# Patient Record
Sex: Female | Born: 1990 | Race: White | Hispanic: No | Marital: Single | Smoking: Current every day smoker
Health system: Southern US, Community
[De-identification: ages and names within clinical notes are randomized; demographics above are authoritative.]

## PROBLEM LIST (undated history)

## (undated) DIAGNOSIS — F32A Depression, unspecified: Secondary | ICD-10-CM

## (undated) DIAGNOSIS — F419 Anxiety disorder, unspecified: Secondary | ICD-10-CM

## (undated) HISTORY — PX: BACK SURGERY: SHX140

## (undated) HISTORY — PX: TONSILLECTOMY: SUR1361

---

## 2014-10-09 ENCOUNTER — Emergency Department (HOSPITAL_COMMUNITY)
Admission: EM | Admit: 2014-10-09 | Discharge: 2014-10-09 | Disposition: A | Discharge status: Home / Self Care | Payer: Self-pay | Attending: Emergency Medicine | Admitting: Emergency Medicine

## 2014-10-09 ENCOUNTER — Encounter (HOSPITAL_COMMUNITY): Payer: Self-pay | Admitting: Neurology

## 2014-10-09 DIAGNOSIS — N898 Other specified noninflammatory disorders of vagina: Secondary | ICD-10-CM | POA: Insufficient documentation

## 2014-10-09 DIAGNOSIS — Z720 Tobacco use: Secondary | ICD-10-CM | POA: Insufficient documentation

## 2014-10-09 DIAGNOSIS — Z880 Allergy status to penicillin: Secondary | ICD-10-CM | POA: Insufficient documentation

## 2014-10-09 LAB — URINALYSIS, ROUTINE W REFLEX MICROSCOPIC
Glucose, UA: NEGATIVE mg/dL
Hgb urine dipstick: NEGATIVE
Ketones, ur: NEGATIVE mg/dL
LEUKOCYTES UA: NEGATIVE
Nitrite: NEGATIVE
PROTEIN: NEGATIVE mg/dL
Specific Gravity, Urine: 1.025 (ref 1.005–1.030)
UROBILINOGEN UA: 1 mg/dL (ref 0.0–1.0)
pH: 6 (ref 5.0–8.0)

## 2014-10-09 LAB — WET PREP, GENITAL
Clue Cells Wet Prep HPF POC: NONE SEEN
Trich, Wet Prep: NONE SEEN
Yeast Wet Prep HPF POC: NONE SEEN

## 2014-10-09 NOTE — ED Notes (Signed)
Pt reports since Monday lower abd pain and white color vaginal discharge.

## 2014-10-09 NOTE — ED Provider Notes (Signed)
CSN: [id number]     Arrival date & time 10/09/14  1717 History   First MD Initiated Contact with Patient 10/09/14 1809     Chief Complaint  Patient presents with  . Vaginal Discharge  . Abdominal Pain     (Consider location/radiation/quality/duration/timing/severity/associated sxs/prior Treatment) Patient is a 24 y.o. female presenting with vaginal discharge. The history is provided by the patient. No language interpreter was used.  Vaginal Discharge Quality:  Clear and white Severity:  Moderate Onset quality:  Gradual Timing:  Intermittent Progression:  Waxing and waning Chronicity:  New Context: spontaneously   Relieved by:  Nothing Worsened by:  Nothing tried Ineffective treatments:  None tried Associated symptoms: no abdominal pain, no dyspareunia, no dysuria, no fever, no genital lesions, no nausea, no rash, no urinary frequency, no urinary incontinence, no vaginal itching and no vomiting   Risk factors: no foreign body, no gynecological surgery and no new sexual partner     History reviewed. No pertinent past medical history. Past Surgical History  Procedure Laterality Date  . Back surgery    . Tonsillectomy     No family history on file. History  Substance Use Topics  . Smoking status: Current Every Day Smoker  . Smokeless tobacco: Not on file  . Alcohol Use: Yes   OB History    No data available     Review of Systems  Constitutional: Negative for fever and fatigue.  Respiratory: Negative for cough, chest tightness and shortness of breath.   Cardiovascular: Negative for chest pain.  Gastrointestinal: Negative for nausea, vomiting and abdominal pain.  Genitourinary: Positive for vaginal discharge. Negative for bladder incontinence, dysuria, urgency, frequency, flank pain, vaginal bleeding, genital sores, menstrual problem, pelvic pain and dyspareunia.  Neurological: Negative for weakness, light-headedness and headaches.  Psychiatric/Behavioral: Negative for  confusion.  All other systems reviewed and are negative.     Allergies  Penicillins  Home Medications   Prior to Admission medications   Not on File   BP 118/58 mmHg  Pulse 79  Temp(Src) 98.8 F (37.1 C) (Oral)  Resp 14  SpO2 99%  LMP 10/05/2014 Physical Exam  Constitutional: She appears well-developed and well-nourished. No distress.  HENT:  Head: Normocephalic and atraumatic.  Nose: Nose normal.  Mouth/Throat: Oropharynx is clear and moist. No oropharyngeal exudate.  Eyes: EOM are normal. Pupils are equal, round, and reactive to light.  Neck: Normal range of motion. Neck supple.  Cardiovascular: Normal rate, regular rhythm, normal heart sounds and intact distal pulses.   No murmur heard. Pulmonary/Chest: Effort normal and breath sounds normal. No respiratory distress. She has no wheezes. She exhibits no tenderness.  Abdominal: Soft. There is no tenderness. There is no rebound and no guarding.  Genitourinary:  Small amount of thin white vaginal discharge.   Nontender, no lesions. No CMT or adnexal tenderness  Musculoskeletal: Normal range of motion. She exhibits no tenderness.  Lymphadenopathy:    She has no cervical adenopathy.  Neurological: She is alert. No cranial nerve deficit. Coordination normal.  Skin: Skin is warm and dry. She is not diaphoretic.  Psychiatric: She has a normal mood and affect. Her behavior is normal. Judgment and thought content normal.  Nursing note and vitals reviewed.   ED Course  Procedures (including critical care time) Labs Review Labs Reviewed  WET PREP, GENITAL - Abnormal; Notable for the following:    WBC, Wet Prep HPF POC FEW (*)    All other components within normal limits  URINALYSIS, ROUTINE  W REFLEX MICROSCOPIC (NOT AT [facility]) - Abnormal; Notable for the following:    Color, Urine AMBER (*)    Bilirubin Urine SMALL (*)    All other components within normal limits  POC URINE PREG, ED  GC/CHLAMYDIA PROBE AMP ([place])  NOT AT [facility]    Imaging Review No results found.   EKG Interpretation None      MDM   Final diagnoses:  Vaginal discharge   Pt is a 24 yo F with no PMH who presents complaining of vaginal discharge.  Has had thin white discharge daily since her menstrual cycle ended 5 days ago.  Reportedly bled her typical amount for 5 days, then finished on Monday.  Used tampons regularly.  Has had persistent discharge since.  Denies malodor.  Denies dysuria, change in urinary consistency or frequency.  Sexually active with males and uses condoms regularly.  Hx of STD 10 years ago, and BV 2 years ago.    Pelvic exam benign.  Small amount of thin white vaginal discharge.   Nontender, no lesions.   Will send a UA, UPT, wet prep and GC/CZ  UPT negative Wet prep with few WBC, otherwise negative.   UA with no nitrites or leuks.  No signs of infection.   No indication that patient has a UTI, BV, or yeast infection.  Clinical exam is benign, no CMT or abdominal tenderness.  Ok for discharge and f/u of GC/CZ to rule out other common STDs.  The discharge is likely just physiological discharge but will f/u STD PRCs to rule out.  Given ED return precautions and all questions were answered prior to dc home in good condition.  Advised that she will receive a phone call if her swabs return positive in the next few days.     Labs were reviewed and interpreted by myself and my attending, and incorporated in the medical decision making.  Patient was seen with ED Attending, Dr. Derrick Ravel, MD   Lenell Antu, MD 10/10/14 1610  Gerhard Munch, MD 10/13/14 1019

## 2014-10-13 LAB — GC/CHLAMYDIA PROBE AMP (~~LOC~~) NOT AT ARMC
Chlamydia: NEGATIVE
Neisseria Gonorrhea: NEGATIVE

## 2015-04-10 ENCOUNTER — Emergency Department (HOSPITAL_COMMUNITY)
Admission: EM | Admit: 2015-04-10 | Discharge: 2015-04-10 | Disposition: A | Discharge status: Home / Self Care | Payer: Self-pay | Attending: Emergency Medicine | Admitting: Emergency Medicine

## 2015-04-10 ENCOUNTER — Encounter (HOSPITAL_COMMUNITY): Payer: Self-pay

## 2015-04-10 DIAGNOSIS — R05 Cough: Secondary | ICD-10-CM

## 2015-04-10 DIAGNOSIS — J069 Acute upper respiratory infection, unspecified: Secondary | ICD-10-CM | POA: Insufficient documentation

## 2015-04-10 DIAGNOSIS — Z880 Allergy status to penicillin: Secondary | ICD-10-CM | POA: Insufficient documentation

## 2015-04-10 DIAGNOSIS — H66004 Acute suppurative otitis media without spontaneous rupture of ear drum, recurrent, right ear: Secondary | ICD-10-CM | POA: Insufficient documentation

## 2015-04-10 DIAGNOSIS — F17210 Nicotine dependence, cigarettes, uncomplicated: Secondary | ICD-10-CM | POA: Insufficient documentation

## 2015-04-10 DIAGNOSIS — H7491 Unspecified disorder of right middle ear and mastoid: Secondary | ICD-10-CM | POA: Insufficient documentation

## 2015-04-10 DIAGNOSIS — R059 Cough, unspecified: Secondary | ICD-10-CM

## 2015-04-10 MED ORDER — AMOXICILLIN 500 MG PO TABS: 500.0000 mg | ORAL_TABLET | Freq: Three times a day (TID) | ORAL | Status: DC

## 2015-04-10 NOTE — ED Notes (Signed)
Onset 1 month runny nose, nasal congestion, post nasal drip.  Onset 2 days ago symptoms worsened now has productive cough- green phlegm, pain in back of neck, sinus pressure, nasal drainage yellow-green.  No swallowing or respiratory difficulties.

## 2015-04-10 NOTE — ED Provider Notes (Signed)
CSN: [id number]     Arrival date & time 04/10/15  1352 History   First MD Initiated Contact with Patient 04/10/15 1519     Chief Complaint  Patient presents with  . URI     (Consider location/radiation/quality/duration/timing/severity/associated sxs/prior Treatment) HPI Comments: Jacqueline Ferguson is a 24 y.o. female with a PSHx of tonsillectomy, who presents to the ED with complaints of URI symptoms 1 month which have worsened over the last 2 days. Her symptoms include cough with green sputum production, sinus pressure, yellowish green rhinorrhea, chills, sore throat, and right ear fullness. Her symptoms have somewhat improved over the course of the month, but 2 days ago they drastically worsened. She has tried TheraFlu, DayQuil, Alka-Seltzer, Robitussin, and Mucinex with some relief. No known diabetes factors. Positive smoker. 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 She denies any fevers, ear pain or drainage, wheezing, drooling, trismus, chest pain, shortness breath, abdominal pain, nausea vomiting, diarrhea, constipation, dysuria, hematuria, numbness, tingling, or weakness. She has a reported allergy to penicillin, but her mother states that she's never been given penicillin it was simply that her mother had a reaction as a child and so when the pt was born they told them the pt was also allergic to PCN. She does not in fact have any known allergies and has never been trialed on penicillins. +Hx of recurrent ear infections as a child.  Patient is a 24 y.o. female presenting with URI. The history is provided by the patient. No language interpreter was used.  URI Presenting symptoms: congestion, cough, ear pain (fullness in R ear), rhinorrhea and sore throat   Presenting symptoms: no fever   Severity:  Moderate Onset quality:  Gradual Duration:  1 month Timing:  Constant Progression:  Worsening Chronicity:  New Relieved by:  OTC medications Worsened by:  Nothing  tried Ineffective treatments:  None tried Associated symptoms: no arthralgias, no myalgias and no wheezing   Risk factors: sick contacts   Risk factors: no immunosuppression     History reviewed. No pertinent past medical history. Past Surgical History  Procedure Laterality Date  . Back surgery    . Tonsillectomy     History reviewed. No pertinent family history. Social History  Substance Use Topics  . Smoking status: Current Every Day Smoker -- 0.50 packs/day    Types: Cigarettes  . Smokeless tobacco: None  . Alcohol Use: Yes     Comment: occ    OB History    No data available     Review of Systems  Constitutional: Positive for chills. Negative for fever.  HENT: Positive for congestion, ear pain (fullness in R ear), rhinorrhea, sinus pressure and sore throat. Negative for drooling, ear discharge and trouble swallowing.   Respiratory: Positive for cough. Negative for shortness of breath and wheezing.   Cardiovascular: Negative for chest pain.  Gastrointestinal: Negative for nausea, vomiting, abdominal pain, diarrhea and constipation.  Genitourinary: Negative for dysuria and hematuria.  Musculoskeletal: Negative for myalgias and arthralgias.  Skin: Negative for color change.  Allergic/Immunologic: Negative for immunocompromised state.  Neurological: Negative for weakness and numbness.  Psychiatric/Behavioral: Negative for confusion.   10 Systems reviewed and are negative for acute change except as noted in the HPI.    Allergies  Penicillins  Home Medications   Prior to Admission medications   Not on File   BP 117/71 mmHg  Pulse 84  Temp(Src) 97.9 F (36.6 C) (Oral)  Resp 18  Ht 5'  7" (1.702 m)  Wt 117.935 kg  BMI 40.71 kg/m2  SpO2 97%  LMP 04/09/2015 Physical Exam  Constitutional: She is oriented to person, place, and time. Vital signs are normal. She appears well-developed and well-nourished.  Non-toxic appearance. No distress.  Afebrile, nontoxic, NAD   HENT:  Head: Normocephalic and atraumatic.  Right Ear: Hearing, external ear and ear canal normal. Tympanic membrane is injected, erythematous and bulging. A middle ear effusion is present.  Left Ear: Hearing, tympanic membrane, external ear and ear canal normal.  Nose: Mucosal edema and rhinorrhea present.  Mouth/Throat: Uvula is midline and mucous membranes are normal. No trismus in the jaw. No uvula swelling. Oropharyngeal exudate and posterior oropharyngeal erythema present. No posterior oropharyngeal edema or tonsillar abscesses.  R ear with suppurative effusion, erythematous TM which is bulging. Canal clear. L ear clear. Nose with mucosal edema and rhinorrhea. Oropharynx without uvular swelling or deviation, no trismus or drooling, no tonsils present, mild erythema, few exudates noted on posterior pillars.  Eyes: Conjunctivae and EOM are normal. Right eye exhibits no discharge. Left eye exhibits no discharge.  Neck: Normal range of motion. Neck supple.  Cardiovascular: Normal rate, regular rhythm, normal heart sounds and intact distal pulses.  Exam reveals no gallop and no friction rub.   No murmur heard. Pulmonary/Chest: Effort normal and breath sounds normal. No respiratory distress. She has no decreased breath sounds. She has no wheezes. She has no rhonchi. She has no rales.  CTAB in all lung fields, no w/r/r, no hypoxia or increased WOB, speaking in full sentences, SpO2 97% on RA   Abdominal: Soft. Normal appearance and bowel sounds are normal. She exhibits no distension. There is no tenderness. There is no rigidity, no rebound, no guarding, no CVA tenderness, no tenderness at McBurney's point and negative Murphy's sign.  Musculoskeletal: Normal range of motion.  Neurological: She is alert and oriented to person, place, and time. She has normal strength. No sensory deficit.  Skin: Skin is warm, dry and intact. No rash noted.  Psychiatric: She has a normal mood and affect.  Nursing note  and vitals reviewed.   ED Course  Procedures (including critical care time) Labs Review Labs Reviewed - No data to display  Imaging Review No results found. I have personally reviewed and evaluated these images and lab results as part of my medical decision-making.   EKG Interpretation None      MDM   Final diagnoses:  Recurrent acute suppurative otitis media of right ear without spontaneous rupture of tympanic membrane  URI (upper respiratory infection)  Cough    24 y.o. female here with cough, right ear fullness, chills, sore throat, and URI symptoms 1 month that initially improved and then worsened 2 days ago. R otitis media noted. Throat with erythema but pt without tonsils. Clear lung exam. Doubt need for strep testing or CXR. Given duration of symptoms, and suppurative AOM, will treat with amoxicillin. Should be noted that pt doesn't have known allergy to PCN, her mother had an allergy and therefore they always stated that she did too, but she has never had PCN as a child. Pt without insurance, wants cheapest Abx. Will treat with amoxicillin on $4 list at [facility]. F/up with [facility] in 1wk to establish care and recheck symptoms. OTC meds discussed. I explained the diagnosis and have given explicit precautions to return to the ER including for any other new or worsening symptoms. The patient understands and accepts the medical plan as it's been  dictated and I have answered their questions. Discharge instructions concerning home care and prescriptions have been given. The patient is STABLE and is discharged to home in good condition.   BP 117/71 mmHg  Pulse 84  Temp(Src) 97.9 F (36.6 C) (Oral)  Resp 18  Ht 5\' 7"  (1.702 m)  Wt 117.935 kg  BMI 40.71 kg/m2  SpO2 97%  LMP 04/09/2015  Meds ordered this encounter  Medications  . amoxicillin (AMOXIL) 500 MG tablet    Sig: Take 1 tablet (500 mg total) by mouth 3 (three) times daily. x10 days    Dispense:  30 tablet    Refill:  0     Order Specific Question:  Supervising Provider    Answer:  Eber HongMILLER, BRIAN [[id number]]     Remon Quinto Camprubi-Soms, PA-C 04/10/15 1542  Derwood KaplanAnkit Nanavati, MD 04/11/15 1547

## 2015-04-10 NOTE — Discharge Instructions (Signed)
Continue to stay well-hydrated. Gargle warm salt water and spit it out. Continue to alternate between Tylenol and Ibuprofen for pain or fever. Use Mucinex for cough suppression/expectoration of mucus. Use netipot and flonase to help with nasal congestion. May consider over-the-counter Benadryl or other antihistamine to decrease secretions and for watery itchy eyes. Take antibiotic as directed for your ear infection. Followup with [facility] in 5-7 days for recheck of ongoing symptoms and to establish care. Return to emergency department for emergent changing or worsening of symptoms.    Otitis Media, Adult Otitis media is redness, soreness, and inflammation of the middle ear. Otitis media may be caused by allergies or, most commonly, by infection. Often it occurs as a complication of the common cold. SIGNS AND SYMPTOMS Symptoms of otitis media may include:  Earache.  Fever.  Ringing in your ear.  Headache.  Leakage of fluid from the ear. DIAGNOSIS To diagnose otitis media, your health care provider will examine your ear with an otoscope. This is an instrument that allows your health care provider to see into your ear in order to examine your eardrum. Your health care provider also will ask you questions about your symptoms. TREATMENT  Typically, otitis media resolves on its own within 3-5 days. Your health care provider may prescribe medicine to ease your symptoms of pain. If otitis media does not resolve within 5 days or is recurrent, your health care provider may prescribe antibiotic medicines if he or she suspects that a bacterial infection is the cause. HOME CARE INSTRUCTIONS   If you were prescribed an antibiotic medicine, finish it all even if you start to feel better.  Take medicines only as directed by your health care provider.  Keep all follow-up visits as directed by your health care provider. SEEK MEDICAL CARE IF:  You have otitis media only in one ear, or  bleeding from your nose, or both.  You notice a lump on your neck.  You are not getting better in 3-5 days.  You feel worse instead of better. SEEK IMMEDIATE MEDICAL CARE IF:   You have pain that is not controlled with medicine.  You have swelling, redness, or pain around your ear or stiffness in your neck.  You notice that part of your face is paralyzed.  You notice that the bone behind your ear (mastoid) is tender when you touch it. MAKE SURE YOU:   Understand these instructions.  Will watch your condition.  Will get help right away if you are not doing well or get worse.   This information is not intended to replace advice given to you by your health care provider. Make sure you discuss any questions you have with your health care provider.   Document Released: 12/31/2003 Document Revised: 04/17/2014 Document Reviewed: 10/22/2012 Elsevier Interactive Patient Education 2016 Elsevier Inc.  Upper Respiratory Infection, Adult Most upper respiratory infections (URIs) are a viral infection of the air passages leading to the lungs. A URI affects the nose, throat, and upper air passages. The most common type of URI is nasopharyngitis and is typically referred to as "the common cold." URIs run their course and usually go away on their own. Most of the time, a URI does not require medical attention, but sometimes a bacterial infection in the upper airways can follow a viral infection. This is called a secondary infection. Sinus and middle ear infections are common types of secondary upper respiratory infections. Bacterial pneumonia can also complicate a URI. A URI  can worsen asthma and chronic obstructive pulmonary disease (COPD). Sometimes, these complications can require emergency medical care and may be life threatening.  CAUSES Almost all URIs are caused by viruses. A virus is a type of germ and can spread from one person to another.  RISKS FACTORS You may be at risk for a URI if:     You smoke.   You have chronic heart or lung disease.  You have a weakened defense (immune) system.   You are very young or very old.   You have nasal allergies or asthma.  You work in crowded or poorly ventilated areas.  You work in health care facilities or schools. SIGNS AND SYMPTOMS  Symptoms typically develop 2-3 days after you come in contact with a cold virus. Most viral URIs last 7-10 days. However, viral URIs from the influenza virus (flu virus) can last 14-18 days and are typically more severe. Symptoms may include:   Runny or stuffy (congested) nose.   Sneezing.   Cough.   Sore throat.   Headache.   Fatigue.   Fever.   Loss of appetite.   Pain in your forehead, behind your eyes, and over your cheekbones (sinus pain).  Muscle aches.  DIAGNOSIS  Your health care provider may diagnose a URI by:  Physical exam.  Tests to check that your symptoms are not due to another condition such as:  Strep throat.  Sinusitis.  Pneumonia.  Asthma. TREATMENT  A URI goes away on its own with time. It cannot be cured with medicines, but medicines may be prescribed or recommended to relieve symptoms. Medicines may help:  Reduce your fever.  Reduce your cough.  Relieve nasal congestion. HOME CARE INSTRUCTIONS   Take medicines only as directed by your health care provider.   Gargle warm saltwater or take cough drops to comfort your throat as directed by your health care provider.  Use a warm mist humidifier or inhale steam from a shower to increase air moisture. This may make it easier to breathe.  Drink enough fluid to keep your urine clear or pale yellow.   Eat soups and other clear broths and maintain good nutrition.   Rest as needed.   Return to work when your temperature has returned to normal or as your health care provider advises. You may need to stay home longer to avoid infecting others. You can also use a face mask and careful  hand washing to prevent spread of the virus.  Increase the usage of your inhaler if you have asthma.   Do not use any tobacco products, including cigarettes, chewing tobacco, or electronic cigarettes. If you need help quitting, ask your health care provider. PREVENTION  The best way to protect yourself from getting a cold is to practice good hygiene.   Avoid oral or hand contact with people with cold symptoms.   Wash your hands often if contact occurs.  There is no clear evidence that vitamin C, vitamin E, echinacea, or exercise reduces the chance of developing a cold. However, it is always recommended to get plenty of rest, exercise, and practice good nutrition.  SEEK MEDICAL CARE IF:   You are getting worse rather than better.   Your symptoms are not controlled by medicine.   You have chills.  You have worsening shortness of breath.  You have brown or red mucus.  You have yellow or brown nasal discharge.  You have pain in your face, especially when you bend forward.  You  have a fever.  You have swollen neck glands.  You have pain while swallowing.  You have white areas in the back of your throat. SEEK IMMEDIATE MEDICAL CARE IF:   You have severe or persistent:  Headache.  Ear pain.  Sinus pain.  Chest pain.  You have chronic lung disease and any of the following:  Wheezing.  Prolonged cough.  Coughing up blood.  A change in your usual mucus.  You have a stiff neck.  You have changes in your:  Vision.  Hearing.  Thinking.  Mood. MAKE SURE YOU:   Understand these instructions.  Will watch your condition.  Will get help right away if you are not doing well or get worse.   This information is not intended to replace advice given to you by your health care provider. Make sure you discuss any questions you have with your health care provider.   Document Released: 09/20/2000 Document Revised: 08/11/2014 Document Reviewed:  07/02/2013 Elsevier Interactive Patient Education 2016 Elsevier Inc.  Cough, Adult A cough helps to clear your throat and lungs. A cough may last only 2-3 weeks (acute), or it may last longer than 8 weeks (chronic). Many different things can cause a cough. A cough may be a sign of an illness or another medical condition. HOME CARE  Pay attention to any changes in your cough.  Take medicines only as told by your doctor.  If you were prescribed an antibiotic medicine, take it as told by your doctor. Do not stop taking it even if you start to feel better.  Talk with your doctor before you try using a cough medicine.  Drink enough fluid to keep your pee (urine) clear or pale yellow.  If the air is dry, use a cold steam vaporizer or humidifier in your home.  Stay away from things that make you cough at work or at home.  If your cough is worse at night, try using extra pillows to raise your head up higher while you sleep.  Do not smoke, and try not to be around smoke. If you need help quitting, ask your doctor.  Do not have caffeine.  Do not drink alcohol.  Rest as needed. GET HELP IF:  You have new problems (symptoms).  You cough up yellow fluid (pus).  Your cough does not get better after 2-3 weeks, or your cough gets worse.  Medicine does not help your cough and you are not sleeping well.  You have pain that gets worse or pain that is not helped with medicine.  You have a fever.  You are losing weight and you do not know why.  You have night sweats. GET HELP RIGHT AWAY IF:  You cough up blood.  You have trouble breathing.  Your heartbeat is very fast.   This information is not intended to replace advice given to you by your health care provider. Make sure you discuss any questions you have with your health care provider.   Document Released: 12/08/2010 Document Revised: 12/16/2014 Document Reviewed: 06/03/2014 Elsevier Interactive Patient Education [employer].

## 2015-06-15 ENCOUNTER — Emergency Department (HOSPITAL_COMMUNITY)
Admission: EM | Admit: 2015-06-15 | Discharge: 2015-06-15 | Disposition: A | Discharge status: Home / Self Care | Payer: Self-pay | Attending: Emergency Medicine | Admitting: Emergency Medicine

## 2015-06-15 ENCOUNTER — Encounter (HOSPITAL_COMMUNITY): Payer: Self-pay | Admitting: Emergency Medicine

## 2015-06-15 DIAGNOSIS — Z880 Allergy status to penicillin: Secondary | ICD-10-CM | POA: Insufficient documentation

## 2015-06-15 DIAGNOSIS — R112 Nausea with vomiting, unspecified: Secondary | ICD-10-CM | POA: Insufficient documentation

## 2015-06-15 DIAGNOSIS — Z792 Long term (current) use of antibiotics: Secondary | ICD-10-CM | POA: Insufficient documentation

## 2015-06-15 DIAGNOSIS — J069 Acute upper respiratory infection, unspecified: Secondary | ICD-10-CM | POA: Insufficient documentation

## 2015-06-15 DIAGNOSIS — F17210 Nicotine dependence, cigarettes, uncomplicated: Secondary | ICD-10-CM | POA: Insufficient documentation

## 2015-06-15 DIAGNOSIS — R197 Diarrhea, unspecified: Secondary | ICD-10-CM | POA: Insufficient documentation

## 2015-06-15 DIAGNOSIS — H6691 Otitis media, unspecified, right ear: Secondary | ICD-10-CM | POA: Insufficient documentation

## 2015-06-15 LAB — RAPID STREP SCREEN (MED CTR MEBANE ONLY): Streptococcus, Group A Screen (Direct): NEGATIVE

## 2015-06-15 MED ORDER — LIDOCAINE HCL (PF) 1 % IJ SOLN
5.0000 mL | Freq: Once | INTRAMUSCULAR | Status: AC
  Administered 2015-06-15: 5 mL
  Filled 2015-06-15: qty 5

## 2015-06-15 MED ORDER — LIDOCAINE VISCOUS 2 % MT SOLN
15.0000 mL | Freq: Once | OROMUCOSAL | Status: AC
  Administered 2015-06-15: 15 mL via OROMUCOSAL
  Filled 2015-06-15: qty 15

## 2015-06-15 MED ORDER — CEFTRIAXONE SODIUM 1 G IJ SOLR
2.0000 g | Freq: Once | INTRAMUSCULAR | Status: AC
  Administered 2015-06-15: 1 g via INTRAMUSCULAR
  Filled 2015-06-15: qty 20

## 2015-06-15 MED ORDER — LIDOCAINE VISCOUS 2 % MT SOLN: 20.0000 mL | OROMUCOSAL | Status: DC | PRN

## 2015-06-15 MED ORDER — LIDOCAINE VISCOUS 2 % MT SOLN: 15.0000 mL | Freq: Once | OROMUCOSAL | Status: DC

## 2015-06-15 NOTE — ED Notes (Signed)
Second injection given for total of 2 Gm Rocephin

## 2015-06-15 NOTE — Discharge Instructions (Signed)
Upper Respiratory Infection, Adult Follow-up with ENT for recurrent right otitis media. Return if symptoms do not improve within 48 hours and you continue to have ear drainage. Drink plenty of fluids and stay well-hydrated. Most upper respiratory infections (URIs) are a viral infection of the air passages leading to the lungs. A URI affects the nose, throat, and upper air passages. The most common type of URI is nasopharyngitis and is typically referred to as "the common cold." URIs run their course and usually go away on their own. Most of the time, a URI does not require medical attention, but sometimes a bacterial infection in the upper airways can follow a viral infection. This is called a secondary infection. Sinus and middle ear infections are common types of secondary upper respiratory infections. Bacterial pneumonia can also complicate a URI. A URI can worsen asthma and chronic obstructive pulmonary disease (COPD). Sometimes, these complications can require emergency medical care and may be life threatening.  CAUSES Almost all URIs are caused by viruses. A virus is a type of germ and can spread from one person to another.  RISKS FACTORS You may be at risk for a URI if:   You smoke.   You have chronic heart or lung disease.  You have a weakened defense (immune) system.   You are very young or very old.   You have nasal allergies or asthma.  You work in crowded or poorly ventilated areas.  You work in health care facilities or schools. SIGNS AND SYMPTOMS  Symptoms typically develop 2-3 days after you come in contact with a cold virus. Most viral URIs last 7-10 days. However, viral URIs from the influenza virus (flu virus) can last 14-18 days and are typically more severe. Symptoms may include:   Runny or stuffy (congested) nose.   Sneezing.   Cough.   Sore throat.   Headache.   Fatigue.   Fever.   Loss of appetite.   Pain in your forehead, behind your eyes,  and over your cheekbones (sinus pain).  Muscle aches.  DIAGNOSIS  Your health care provider may diagnose a URI by:  Physical exam.  Tests to check that your symptoms are not due to another condition such as:  Strep throat.  Sinusitis.  Pneumonia.  Asthma. TREATMENT  A URI goes away on its own with time. It cannot be cured with medicines, but medicines may be prescribed or recommended to relieve symptoms. Medicines may help:  Reduce your fever.  Reduce your cough.  Relieve nasal congestion. HOME CARE INSTRUCTIONS   Take medicines only as directed by your health care provider.   Gargle warm saltwater or take cough drops to comfort your throat as directed by your health care provider.  Use a warm mist humidifier or inhale steam from a shower to increase air moisture. This may make it easier to breathe.  Drink enough fluid to keep your urine clear or pale yellow.   Eat soups and other clear broths and maintain good nutrition.   Rest as needed.   Return to work when your temperature has returned to normal or as your health care provider advises. You may need to stay home longer to avoid infecting others. You can also use a face mask and careful hand washing to prevent spread of the virus.  Increase the usage of your inhaler if you have asthma.   Do not use any tobacco products, including cigarettes, chewing tobacco, or electronic cigarettes. If you need help quitting, ask your  health care provider. PREVENTION  The best way to protect yourself from getting a cold is to practice good hygiene.   Avoid oral or hand contact with people with cold symptoms.   Wash your hands often if contact occurs.  There is no clear evidence that vitamin C, vitamin E, echinacea, or exercise reduces the chance of developing a cold. However, it is always recommended to get plenty of rest, exercise, and practice good nutrition.  SEEK MEDICAL CARE IF:   You are getting worse rather than  better.   Your symptoms are not controlled by medicine.   You have chills.  You have worsening shortness of breath.  You have brown or red mucus.  You have yellow or brown nasal discharge.  You have pain in your face, especially when you bend forward.  You have a fever.  You have swollen neck glands.  You have pain while swallowing.  You have white areas in the back of your throat. SEEK IMMEDIATE MEDICAL CARE IF:   You have severe or persistent:  Headache.  Ear pain.  Sinus pain.  Chest pain.  You have chronic lung disease and any of the following:  Wheezing.  Prolonged cough.  Coughing up blood.  A change in your usual mucus.  You have a stiff neck.  You have changes in your:  Vision.  Hearing.  Thinking.  Mood. MAKE SURE YOU:   Understand these instructions.  Will watch your condition.  Will get help right away if you are not doing well or get worse.   This information is not intended to replace advice given to you by your health care provider. Make sure you discuss any questions you have with your health care provider.   Document Released: 09/20/2000 Document Revised: 08/11/2014 Document Reviewed: 07/02/2013 Elsevier Interactive Patient Education 2016 Elsevier Inc.  Otitis Media, Adult Otitis media is redness, soreness, and puffiness (swelling) in the space just behind your eardrum (middle ear). It may be caused by allergies or infection. It often happens along with a cold. HOME CARE  Take your medicine as told. Finish it even if you start to feel better.  Only take over-the-counter or prescription medicines for pain, discomfort, or fever as told by your doctor.  Follow up with your doctor as told. GET HELP IF:  You have otitis media only in one ear, or bleeding from your nose, or both.  You notice a lump on your neck.  You are not getting better in 3-5 days.  You feel worse instead of better. GET HELP RIGHT AWAY IF:   You  have pain that is not helped with medicine.  You have puffiness, redness, or pain around your ear.  You get a stiff neck.  You cannot move part of your face (paralysis).  You notice that the bone behind your ear hurts when you touch it. MAKE SURE YOU:   Understand these instructions.  Will watch your condition.  Will get help right away if you are not doing well or get worse.   This information is not intended to replace advice given to you by your health care provider. Make sure you discuss any questions you have with your health care provider.   Document Released: 09/13/2007 Document Revised: 04/17/2014 Document Reviewed: 10/22/2012 Elsevier Interactive Patient Education [employer]

## 2015-06-15 NOTE — ED Provider Notes (Signed)
History  By signing my name below, I, Karle PlumberJennifer Tensley, attest that this documentation has been prepared under the direction and in the presence of Federated Department StoresHanna Patel-Mills, PA-C. Electronically Signed: Karle PlumberJennifer Tensley, ED Scribe. 06/15/2015. 11:24 AM  Chief Complaint  Patient presents with  . Nasal Congestion   The history is provided by the patient and medical records. No language interpreter was used.    HPI Comments:  Jacqueline Ferguson is a 25 y.o. female who presents to the Emergency Department complaining of nausea, vomiting and diarrhea that began three days ago. She states these symptoms last for two days with associated chills. She reports generalized body aches, purulent right ear drianage, sinus pressure, sore throat, dry cough and nasal congestion that began yesterday. She reports clear and yellow rhinorrhea and fever Tmax 99.7 degrees. 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 She has been taking Sudafed with minimal relief of the symptoms. She denies modifying factors. She denies current nausea, vomiting or diarrhea. She is allergic to PCN.  History reviewed. No pertinent past medical history. Past Surgical History  Procedure Laterality Date  . Back surgery    . Tonsillectomy     History reviewed. No pertinent family history. Social History  Substance Use Topics  . Smoking status: Current Every Day Smoker -- 0.50 packs/day    Types: Cigarettes  . Smokeless tobacco: None  . Alcohol Use: Yes     Comment: occ    OB History    No data available     Review of Systems  Constitutional: Positive for fever and chills.  HENT: Positive for congestion, ear discharge, rhinorrhea, sinus pressure and sore throat.   Respiratory: Positive for cough.   Gastrointestinal: Negative for nausea, vomiting and diarrhea.  Musculoskeletal: Positive for myalgias.    Allergies   Penicillins  Home Medications   Prior to Admission medications   Medication Sig Start Date End Date Taking? Authorizing Provider  amoxicillin (AMOXIL) 500 MG tablet Take 1 tablet (500 mg total) by mouth 3 (three) times daily. x10 days 04/10/15   Mercedes Camprubi-Soms, PA-C  lidocaine (XYLOCAINE) 2 % solution Use as directed 20 mLs in the mouth or throat as needed for mouth pain. 06/15/15   Catha GosselinHanna Patel-Mills, PA-C   Triage Vitals: BP 114/81 mmHg  Pulse 95  Temp(Src) 99.4 F (37.4 C) (Oral)  Resp 18  SpO2 96%  LMP 06/09/2015 Physical Exam  Constitutional: She is oriented to person, place, and time. She appears well-developed and well-nourished.  HENT:  Head: Normocephalic and atraumatic.  Right ear: Green purulent drainage from the canal. Eardrum is intact.  Throat: No tonsillar edema or exudates. Oropharynx appears normal. No drooling or trismus. No anterior cervical lymphadenopathy.  Eyes: EOM are normal.  Neck: Normal range of motion.  Cardiovascular: Normal rate.   Pulmonary/Chest: Effort normal.  Lungs clear to auscultation bilaterally. No wheezing or decreased breath sounds. Heart: Regular rate and rhythm. No murmur.  Abdominal:  Obese.  Musculoskeletal: Normal range of motion.  Neurological: She is alert and oriented to person, place, and time.  Skin: Skin is warm and dry.  Psychiatric: She has a normal mood and affect. Her behavior is normal.  Nursing note and vitals reviewed.   ED Course  Procedures (including critical care time) DIAGNOSTIC STUDIES: Oxygen Saturation is 96% on RA, adequate by my interpretation.   COORDINATION OF CARE: 10:27 AM- Will  order rapid strep test. Pt verbalizes understanding and agrees to plan.  Medications  lidocaine (XYLOCAINE) 2 % viscous mouth solution 15 mL (15 mLs Mouth/Throat Given 06/15/15 1038)  cefTRIAXone (ROCEPHIN) injection 2 g (1 g Intramuscular Given 06/15/15 1149)  lidocaine (PF) (XYLOCAINE) 1 % injection 5 mL (5 mLs Other  Given 06/15/15 1148)  lidocaine (PF) (XYLOCAINE) 1 % injection 5 mL (5 mLs Other Given 06/15/15 1158)    Labs Review Labs Reviewed  RAPID STREP SCREEN (NOT AT [facility])  CULTURE, GROUP A STREP [facility])    MDM   Final diagnoses:  URI (upper respiratory infection)  Recurrent acute otitis media of right ear, unspecified otitis media type  Patient presents for nausea, vomiting, and diarrhea 2 days but has resolved since. Yesterday she began having body aches, dry cough, with nasal congestion and sore throat. She also reports having right ear drainage since she was around the age of 89 after an ear tube punctured her eardrum. She has never been followed up for this.He was given ENT referral. Medications  lidocaine (XYLOCAINE) 2 % viscous mouth solution 15 mL (15 mLs Mouth/Throat Given 06/15/15 1038)  cefTRIAXone (ROCEPHIN) injection 2 g (1 g Intramuscular Given 06/15/15 1149)  lidocaine (PF) (XYLOCAINE) 1 % injection 5 mL (5 mLs Other Given 06/15/15 1148)  lidocaine (PF) (XYLOCAINE) 1 % injection 5 mL (5 mLs Other Given 06/15/15 1158)   Strep is negative. I discussed that she would be treated symptomatically with viscous lidocaine. She was given a work note. Patient agrees with plan. Return precautions discussed.  Filed Vitals:   06/15/15 0955 06/15/15 1236  BP: 114/81 120/72  Pulse: 95 92  Temp: 99.4 F (37.4 C) 98.3 F (36.8 C)  Resp: 18 18   I personally performed the services described in this documentation, which was scribed in my presence. The recorded information has been reviewed and is accurate.     Catha Gosselin, PA-C 06/15/15 1548  Mancel Bale, MD 06/15/15 [phone]

## 2015-06-15 NOTE — ED Notes (Signed)
Pt reports nasal congestion, sore throat, facial pain, low grade fever. Has tried OTC med with little relief.

## 2015-06-15 NOTE — ED Notes (Signed)
Pt reports she had N/V/D x 2 days, Saturday and Sunday. Started yesterday with bodyaches and nasal congestion.

## 2015-06-17 LAB — CULTURE, GROUP A STREP (THRC)

## 2015-07-07 ENCOUNTER — Emergency Department (HOSPITAL_COMMUNITY): Payer: Self-pay

## 2015-07-07 ENCOUNTER — Encounter (HOSPITAL_COMMUNITY): Payer: Self-pay | Admitting: Emergency Medicine

## 2015-07-07 ENCOUNTER — Emergency Department (HOSPITAL_COMMUNITY)
Admission: EM | Admit: 2015-07-07 | Discharge: 2015-07-07 | Disposition: A | Discharge status: Home / Self Care | Payer: Self-pay | Attending: Emergency Medicine | Admitting: Emergency Medicine

## 2015-07-07 DIAGNOSIS — J069 Acute upper respiratory infection, unspecified: Secondary | ICD-10-CM | POA: Insufficient documentation

## 2015-07-07 DIAGNOSIS — R109 Unspecified abdominal pain: Secondary | ICD-10-CM | POA: Insufficient documentation

## 2015-07-07 DIAGNOSIS — H6691 Otitis media, unspecified, right ear: Secondary | ICD-10-CM | POA: Insufficient documentation

## 2015-07-07 DIAGNOSIS — F17210 Nicotine dependence, cigarettes, uncomplicated: Secondary | ICD-10-CM | POA: Insufficient documentation

## 2015-07-07 DIAGNOSIS — Z880 Allergy status to penicillin: Secondary | ICD-10-CM | POA: Insufficient documentation

## 2015-07-07 LAB — RAPID STREP SCREEN (MED CTR MEBANE ONLY): Streptococcus, Group A Screen (Direct): NEGATIVE

## 2015-07-07 MED ORDER — IBUPROFEN 400 MG PO TABS
800.0000 mg | ORAL_TABLET | Freq: Once | ORAL | Status: AC
Start: 2015-07-07 — End: 2015-07-07
  Administered 2015-07-07: 800 mg via ORAL
  Filled 2015-07-07: qty 2

## 2015-07-07 MED ORDER — AMOXICILLIN-POT CLAVULANATE 875-125 MG PO TABS: 1.0000 | ORAL_TABLET | Freq: Two times a day (BID) | ORAL | Status: DC

## 2015-07-07 MED ORDER — BENZONATATE 100 MG PO CAPS: 100.0000 mg | ORAL_CAPSULE | Freq: Three times a day (TID) | ORAL | Status: DC

## 2015-07-07 NOTE — Discharge Instructions (Signed)
1. Medications: augmentin, tessalon for cough, usual home medications 2. Treatment: rest, drink plenty of fluids 3. Follow Up: please followup with your primary doctor in 2-3 days for discussion of your diagnoses and further evaluation after today's visit; if you do not have a primary care doctor use the phone number listed in your discharge paperwork to find one; please return to the ER for high fever, increased pain, new or worsening symptoms   Otitis Media, Adult Otitis media is redness, soreness, and puffiness (swelling) in the space just behind your eardrum (middle ear). It may be caused by allergies or infection. It often happens along with a cold. HOME CARE  Take your medicine as told. Finish it even if you start to feel better.  Only take over-the-counter or prescription medicines for pain, discomfort, or fever as told by your doctor.  Follow up with your doctor as told. GET HELP IF:  You have otitis media only in one ear, or bleeding from your nose, or both.  You notice a lump on your neck.  You are not getting better in 3-5 days.  You feel worse instead of better. GET HELP RIGHT AWAY IF:   You have pain that is not helped with medicine.  You have puffiness, redness, or pain around your ear.  You get a stiff neck.  You cannot move part of your face (paralysis).  You notice that the bone behind your ear hurts when you touch it. MAKE SURE YOU:   Understand these instructions.  Will watch your condition.  Will get help right away if you are not doing well or get worse.   This information is not intended to replace advice given to you by your health care provider. Make sure you discuss any questions you have with your health care provider.   Document Released: 09/13/2007 Document Revised: 04/17/2014 Document Reviewed: 10/22/2012 Elsevier Interactive Patient Education 2016 Elsevier Inc.  Viral Infections A virus is a type of germ. Viruses can cause:  Minor sore  throats.  Aches and pains.  Headaches.  Runny nose.  Rashes.  Watery eyes.  Tiredness.  Coughs.  Loss of appetite.  Feeling sick to your stomach (nausea).  Throwing up (vomiting).  Watery poop (diarrhea). HOME CARE   Only take medicines as told by your doctor.  Drink enough water and fluids to keep your pee (urine) clear or pale yellow. Sports drinks are a good choice.  Get plenty of rest and eat healthy. Soups and broths with crackers or rice are fine. GET HELP RIGHT AWAY IF:   You have a very bad headache.  You have shortness of breath.  You have chest pain or neck pain.  You have an unusual rash.  You cannot stop throwing up.  You have watery poop that does not stop.  You cannot keep fluids down.  You or your child has a temperature by mouth above 102 F (38.9 C), not controlled by medicine.  Your baby is older than 3 months with a rectal temperature of 102 F (38.9 C) or higher.  Your baby is 70 months old or younger with a rectal temperature of 100.4 F (38 C) or higher. MAKE SURE YOU:   Understand these instructions.  Will watch this condition.  Will get help right away if you are not doing well or get worse.   This information is not intended to replace advice given to you by your health care provider. Make sure you discuss any questions you have with  your health care provider.   Document Released: 03/09/2008 Document Revised: 06/19/2011 Document Reviewed: 09/02/2014 Elsevier Interactive Patient Education [employer] Inc[employer]

## 2015-07-07 NOTE — ED Notes (Signed)
Pt sts body aches and nasal congestion with some fever last night

## 2015-07-07 NOTE — ED Provider Notes (Signed)
CSN: [id number]     Arrival date & time 07/07/15  1810 History  By signing my name below, I, Bethel Born, attest that this documentation has been prepared under the direction and in the presence of [employer]. Electronically Signed: Bethel Born, ED Scribe. 07/07/2015 7:05 PM   Chief Complaint  Patient presents with  . Generalized Body Aches  . Nasal Congestion     The history is provided by the patient. No language interpreter was used.    Jacqueline Ferguson is a 25 y.o. female who presents to the Emergency Department complaining of fever and chills with onset this morning near 3:30 AM. Pt states that she woke up sweating and feeling feverish. Her temperature reached 100.7 at home. Associated symptoms include sinus pressure, bilateral muffled hearing and ear discomfort, mild sore throat, productive cough, nasal congestion, body aches, and abdominal pain. Tylenol (last dose 9 hours ago) and Mucinex have provided no relief at home. Nothing makes her symptoms worse. She was seen in the ED 2 weeks ago for similar symptoms nd was given "2 shots" and a referral to follow-up with ENT. Pt denies chest pain, SOB, nausea, vomiting, and diarrhea.   History reviewed. No pertinent past medical history. Past Surgical History  Procedure Laterality Date  . Back surgery    . Tonsillectomy     History reviewed. No pertinent family history. Social History  Substance Use Topics  . Smoking status: Current Every Day Smoker -- 0.50 packs/day    Types: Cigarettes  . Smokeless tobacco: None  . Alcohol Use: Yes     Comment: occ    OB History    No data available       Review of Systems  Constitutional: Positive for fever and chills.  HENT: Positive for congestion, ear pain, hearing loss (muffled) and sinus pressure.   Respiratory: Positive for cough. Negative for shortness of breath.   Cardiovascular: Negative for chest pain.  Gastrointestinal: Positive for abdominal pain.  Negative for nausea, vomiting and diarrhea.  Musculoskeletal: Positive for myalgias.     Allergies  Penicillins  Home Medications   Prior to Admission medications   Medication Sig Start Date End Date Taking? Authorizing Provider  amoxicillin (AMOXIL) 500 MG tablet Take 1 tablet (500 mg total) by mouth 3 (three) times daily. x10 days 04/10/15   Mercedes Camprubi-Soms, PA-C  amoxicillin-clavulanate (AUGMENTIN) 875-125 MG tablet Take 1 tablet by mouth every 12 (twelve) hours. 07/07/15   Mady Gemma, PA-C  benzonatate (TESSALON) 100 MG capsule Take 1 capsule (100 mg total) by mouth every 8 (eight) hours. 07/07/15   Mady Gemma, PA-C  lidocaine (XYLOCAINE) 2 % solution Use as directed 20 mLs in the mouth or throat as needed for mouth pain. 06/15/15   Hanna Patel-Mills, PA-C    BP 130/80 mmHg  Pulse 102  Temp(Src) 100.1 F (37.8 C) (Oral)  Resp 18  SpO2 98%  LMP 06/09/2015 Physical Exam  Constitutional: She is oriented to person, place, and time. She appears well-developed and well-nourished. No distress.  HENT:  Head: Normocephalic and atraumatic.  Right Ear: Hearing, external ear and ear canal normal. No mastoid tenderness. Tympanic membrane is erythematous and bulging.  Left Ear: Hearing, tympanic membrane, external ear and ear canal normal. No mastoid tenderness. Tympanic membrane is not erythematous and not bulging.  Nose: Rhinorrhea present. Right sinus exhibits no maxillary sinus tenderness and no frontal sinus tenderness. Left sinus exhibits no maxillary sinus tenderness and no frontal sinus tenderness.  Mouth/Throat: Uvula is midline and mucous membranes are normal. Posterior oropharyngeal erythema present. No oropharyngeal exudate, posterior oropharyngeal edema or tonsillar abscesses.  Erythema and bulging to right TM. Mild tonsillar hypertrophy and erythema bilaterally. Patient handling her secretions well.  Eyes: Conjunctivae, EOM and lids are normal. Pupils are  equal, round, and reactive to light. Right eye exhibits no discharge. Left eye exhibits no discharge. No scleral icterus.  Neck: Normal range of motion. Neck supple.  Cardiovascular: Normal rate, regular rhythm, normal heart sounds, intact distal pulses and normal pulses.   Pulmonary/Chest: Effort normal and breath sounds normal. No respiratory distress. She has no wheezes. She has no rales.  Abdominal: Soft. Normal appearance and bowel sounds are normal. She exhibits no distension and no mass. There is no tenderness. There is no rigidity, no rebound and no guarding.  Abdomen soft, non-tender, non-distended.  Musculoskeletal: Normal range of motion. She exhibits no edema or tenderness.  Neurological: She is alert and oriented to person, place, and time.  Skin: Skin is warm, dry and intact. No rash noted. She is not diaphoretic. No erythema. No pallor.  Psychiatric: She has a normal mood and affect. Her speech is normal and behavior is normal.  Nursing note and vitals reviewed.   ED Course  Procedures (including critical care time)  DIAGNOSTIC STUDIES: Oxygen Saturation is 98% on RA,  normal by my interpretation.    COORDINATION OF CARE: 7:01 PM Discussed treatment plan which includes rapid strep screen, CXR, and ibuprofen with pt at bedside and pt agreed to plan.  Labs Review Labs Reviewed  RAPID STREP SCREEN (NOT AT [facility])  CULTURE, GROUP A STREP [facility])    Imaging Review Dg Chest 2 View  07/07/2015  CLINICAL DATA:  Fever and productive cough EXAM: CHEST  2 VIEW COMPARISON:  None. FINDINGS: Normal heart size. Normal mediastinal contour. No pneumothorax. No pleural effusion. Lungs appear clear, with no acute consolidative airspace disease and no pulmonary edema. IMPRESSION: No active cardiopulmonary disease. Electronically Signed   By: Delbert Phenix M.D.   On: 07/07/2015 19:38   I have personally reviewed and evaluated these images and lab results as part of my medical  decision-making.   EKG Interpretation None      MDM   Final diagnoses:  URI (upper respiratory infection)  Acute right otitis media, recurrence not specified, unspecified otitis media type    25 year old female presents with fever, chills, ear pain, sinus pressure, nasal congestion, sore throat, productive cough, generalized body aches, and abdominal pain (now resolved), which started this morning. 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 Patient's temp 100.1. Erythema and bulging to right TM. Mild tonsillar hypertrophy and erythema bilaterally. Patient handling her secretions well. No tenderness to palpation to sinuses. Heart regular rate and rhythm. Lungs clear to auscultation bilaterally. Abdomen soft, nontender, nondistended. No rebound, guarding, or masses.   Will obtain rapid strep and chest x-ray Will give ibuprofen for symptoms.  Rapid strep negative. CXR no active cardiopulmonary disease. Patient is non-toxic and well-appearing, feel she is stable for discharge at this time. Will treat with augmentin for otitis media (PCN allergy listed, though patient states her mom has an allergy to PCN and that she is unsure what happens when she takes PCN; notes she has taken amoxicillin in the past with no allergic reaction). Patient to follow-up with PCP. Strict return precautions discussed. Patient verbalizes her understanding and is in agreement with plan.  BP 130/80 mmHg  Pulse 102  Temp(Src) 100.1 F (37.8 C) (Oral)  Resp 18  SpO2 98%  LMP 06/30/2015  I personally performed the services described in this documentation, which was scribed in my presence. The recorded information has been reviewed and is accurate.    Mady Gemmalizabeth C Westfall, PA-C 07/07/15 2005  Pricilla LovelessScott Goldston, MD 07/13/15 [phone]

## 2015-07-10 LAB — CULTURE, GROUP A STREP (THRC)

## 2018-06-04 ENCOUNTER — Encounter (HOSPITAL_BASED_OUTPATIENT_CLINIC_OR_DEPARTMENT_OTHER): Payer: Self-pay | Admitting: Emergency Medicine

## 2018-06-04 ENCOUNTER — Emergency Department (HOSPITAL_BASED_OUTPATIENT_CLINIC_OR_DEPARTMENT_OTHER)
Admission: EM | Admit: 2018-06-04 | Discharge: 2018-06-04 | Disposition: A | Discharge status: Home / Self Care | Payer: Self-pay | Attending: Emergency Medicine | Admitting: Emergency Medicine

## 2018-06-04 ENCOUNTER — Other Ambulatory Visit: Payer: Self-pay

## 2018-06-04 DIAGNOSIS — J0110 Acute frontal sinusitis, unspecified: Secondary | ICD-10-CM | POA: Insufficient documentation

## 2018-06-04 DIAGNOSIS — Z79899 Other long term (current) drug therapy: Secondary | ICD-10-CM | POA: Insufficient documentation

## 2018-06-04 DIAGNOSIS — F17210 Nicotine dependence, cigarettes, uncomplicated: Secondary | ICD-10-CM | POA: Insufficient documentation

## 2018-06-04 HISTORY — DX: Anxiety disorder, unspecified: F41.9

## 2018-06-04 MED ORDER — AMOXICILLIN-POT CLAVULANATE 875-125 MG PO TABS: 1.0000 | ORAL_TABLET | Freq: Two times a day (BID) | ORAL | 0 refills | Status: AC

## 2018-06-04 MED FILL — AMOX-CLAV 875-125 MG TABLET: 875-125 | 7 days supply | Qty: 14 | Fill #0

## 2018-06-04 NOTE — ED Notes (Addendum)
Pt c/o nasal and chest congestion x 2 weeks. Pt also states she has sore throat with generalized body aches x 2-3 days. Pt denies fever. Pt treating at home with dayquil and tylenol cold and flu with no relief.

## 2018-06-04 NOTE — ED Notes (Signed)
ED Provider at bedside. 

## 2018-06-04 NOTE — ED Triage Notes (Signed)
Pt c/o nasal and chest congestion with cough x 2 weeks. Pt also c/o sore throat with generalized body aches that started over last 2 days. Pt denies fever. Pt states symptoms not responding to OTC medication Dayquil and Vicks vaporub.

## 2018-06-04 NOTE — ED Provider Notes (Addendum)
[facility] EMERGENCY DEPARTMENT Provider Note   CSN: [id number] Arrival date & time: 06/04/18  3212    History   Chief Complaint Chief Complaint  Patient presents with  . Nasal Congestion    HPI Jacqueline Ferguson is a 28 y.o. female.     HPI   Patient states that she has had cold-like symptoms for the last 2 weeks.  She is a Administrator, sports.  She has had nasal congestion, sore throat, body aches, cough.  Is having greenish-yellow nasal discharge and productive sputum.  She had felt better for a couple of days last week but her symptoms never completely resolved before worsening again.  She is having some headaches and facial fullness.  She has no fevers or chills.  Has no shortness of breath or wheezing.  No nausea or vomiting.  No diarrhea. Did not get her seasonal influenza vaccination this year he  Past Medical History:  Diagnosis Date  . Anxiety     There are no active problems to display for this patient.   Past Surgical History:  Procedure Laterality Date  . BACK SURGERY    . TONSILLECTOMY       OB History   No obstetric history on file.      Home Medications    Prior to Admission medications   Medication Sig Start Date End Date Taking? Authorizing Provider  propranolol (INDERAL) 10 MG tablet Take 10 mg by mouth 3 (three) times daily.   Yes [provider]  sertraline (ZOLOFT) 100 MG tablet Take 100 mg by mouth daily.   Yes [provider]  amoxicillin (AMOXIL) 500 MG tablet Take 1 tablet (500 mg total) by mouth 3 (three) times daily. x10 days 04/10/15   Street, [place], PA-C  amoxicillin-clavulanate (AUGMENTIN) 875-125 MG tablet Take 1 tablet by mouth every 12 (twelve) hours. 07/07/15   Mady Gemma, PA-C  benzonatate (TESSALON) 100 MG capsule Take 1 capsule (100 mg total) by mouth every 8 (eight) hours. 07/07/15   Mady Gemma, PA-C  lidocaine (XYLOCAINE) 2 % solution Use as directed 20 mLs in the mouth or  throat as needed for mouth pain. 06/15/15   Patel-Mills, Lorelle Formosa, PA-C    Family History History reviewed. No pertinent family history.  Social History Social History   Tobacco Use  . Smoking status: Current Every Day Smoker    Packs/day: 0.50    Types: Cigarettes  . Smokeless tobacco: Never Used  Substance Use Topics  . Alcohol use: Yes    Comment: occ   . Drug use: No     Allergies   Penicillins   Review of Systems Review of Systems  Constitutional: Negative for chills and fever.  HENT: Positive for congestion, rhinorrhea, sinus pain and sore throat. Negative for ear discharge, ear pain and voice change.   Respiratory: Negative for shortness of breath.   Cardiovascular: Negative for chest pain.  Gastrointestinal: Negative for abdominal pain, diarrhea, nausea and vomiting.  Skin: Negative for rash.  Neurological: Positive for headaches. Negative for dizziness and light-headedness.     Physical Exam Updated Vital Signs BP 115/73 (BP Location: Right Arm)   Pulse 78   Temp 98.1 F (36.7 C) (Oral)   Resp 18   Ht 5\' 7"  (1.702 m)   Wt 122.5 kg   LMP 05/22/2018 (Approximate)   SpO2 99%   BMI 42.29 kg/m   Physical Exam Constitutional:      General: She is not in acute distress.  Appearance: Normal appearance. She is not ill-appearing, toxic-appearing or diaphoretic.  HENT:     Head: Normocephalic and atraumatic.      Right Ear: Tympanic membrane, ear canal and external ear normal.     Left Ear: Tympanic membrane, ear canal and external ear normal.     Nose: Congestion present. No rhinorrhea.     Mouth/Throat:     Mouth: Mucous membranes are moist.     Pharynx: Oropharynx is clear. No oropharyngeal exudate or posterior oropharyngeal erythema.  Eyes:     Conjunctiva/sclera: Conjunctivae normal.  Neck:     Musculoskeletal: Normal range of motion.  Cardiovascular:     Rate and Rhythm: Normal rate and regular rhythm.     Heart sounds: Normal heart sounds. No  murmur.  Pulmonary:     Effort: Pulmonary effort is normal.     Breath sounds: Normal breath sounds. No wheezing or rhonchi.  Abdominal:     General: Abdomen is flat. Bowel sounds are normal. There is no distension.     Tenderness: There is no abdominal tenderness. There is no guarding or rebound.  Musculoskeletal: Normal range of motion.  Lymphadenopathy:     Cervical: No cervical adenopathy.  Neurological:     General: No focal deficit present.     Mental Status: She is alert and oriented to person, place, and time.  Psychiatric:        Mood and Affect: Mood normal.        Behavior: Behavior normal.      ED Treatments / Results  Labs (all labs ordered are listed, but only abnormal results are displayed) Labs Reviewed - No data to display  EKG None  Radiology No results found.  Procedures Procedures (including critical care time)  Medications Ordered in ED Medications - No data to display   Initial Impression / Assessment and Plan / ED Course  I have reviewed the triage vital signs and the nursing notes.  Pertinent labs & imaging results that were available during my care of the patient were reviewed by me and considered in my medical decision making (see chart for details).        Patient is afebrile and well-appearing.  She has had a little over 2 weeks of cold-like symptoms with double worsening and now has sinus pain consistent with acute bacterial sinusitis.  Per chart review patient does not have a documented allergy to penicillin but rather her mother had penicillin allergy and she has tolerated both amoxicillin and Augmentin well in the past. Confirmed with patient that not allergic to amoxicillin. Treat with a 7-day course of Augmentin and recommended supportive care.  Final Clinical Impressions(s) / ED Diagnoses   Final diagnoses:  Acute non-recurrent frontal sinusitis    ED Discharge Orders         Ordered    amoxicillin-clavulanate (AUGMENTIN)  875-125 MG tablet  2 times daily     06/04/18 0933           Leland Her, DO 06/04/18 3893    Leland Her, DO 06/04/18 1015    Alvira Monday, MD 06/04/18 2315

## 2020-04-19 ENCOUNTER — Telehealth: Payer: Self-pay | Admitting: Family

## 2020-04-19 DIAGNOSIS — B373 Candidiasis of vulva and vagina: Secondary | ICD-10-CM

## 2020-04-19 DIAGNOSIS — B3731 Acute candidiasis of vulva and vagina: Secondary | ICD-10-CM

## 2020-04-19 MED ORDER — FLUCONAZOLE 150 MG PO TABS: 150.0000 mg | ORAL_TABLET | ORAL | 0 refills | Status: DC | PRN

## 2020-04-19 NOTE — Progress Notes (Signed)

## 2020-09-26 ENCOUNTER — Encounter: Payer: Self-pay | Admitting: Family

## 2020-09-26 ENCOUNTER — Telehealth: Payer: Self-pay | Admitting: Family

## 2020-09-26 DIAGNOSIS — J209 Acute bronchitis, unspecified: Secondary | ICD-10-CM

## 2020-09-26 MED ORDER — BENZONATATE 100 MG PO CAPS: 100.0000 mg | ORAL_CAPSULE | Freq: Three times a day (TID) | ORAL | 0 refills | Status: AC | PRN

## 2020-09-26 MED ORDER — FLUTICASONE PROPIONATE 50 MCG/ACT NA SUSP: 2.0000 | Freq: Every day | NASAL | 6 refills | Status: AC

## 2020-09-26 MED ORDER — PREDNISONE 10 MG (21) PO TBPK: ORAL_TABLET | ORAL | 0 refills | Status: AC

## 2020-09-26 NOTE — Progress Notes (Signed)
Virtual Visit Consent   Jacqueline Ferguson, you are scheduled for a virtual visit with a  provider today.     Just as with appointments in the office, your consent must be obtained to participate.  Your consent will be active for this visit and any virtual visit you may have with one of our providers in the next 365 days.     If you have a MyChart account, a copy of this consent can be sent to you electronically.  All virtual visits are billed to your insurance company just like a traditional visit in the office.    As this is a virtual visit, video technology does not allow for your provider to perform a traditional examination.  This may limit your provider's ability to fully assess your condition.  If your provider identifies any concerns that need to be evaluated in person or the need to arrange testing (such as labs, EKG, etc.), we will make arrangements to do so.     Although advances in technology are sophisticated, we cannot ensure that it will always work on either your end or our end.  If the connection with a video visit is poor, the visit may have to be switched to a telephone visit.  With either a video or telephone visit, we are not always able to ensure that we have a secure connection.     I need to obtain your verbal consent now.   Are you willing to proceed with your visit today?    Jacqueline Ferguson has provided verbal consent on 09/26/2020 for a virtual visit (video or telephone).   Jacqueline Rodney, FNP   Date: 09/26/2020 1:07 PM   Virtual Visit via Video Note   Jacqueline Ferguson, connected [email or web address] ([id number], 17-Dec-1990) on 09/26/20 at  1:15 PM EDT by a video-enabled telemedicine application and verified that I am speaking with the correct person using two identifiers.  Location: Patient: Virtual Visit Location Patient: Home Provider: Virtual Visit Location Provider: Home   I discussed the limitations of evaluation and management by telemedicine and the  availability of in person appointments. The patient expressed understanding and agreed to proceed.    History of Present Illness: Jacqueline Ferguson is a 30 y.o. who identifies as a female who was assigned female at birth, and is being seen today for congestion and URI symptoms. 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 She took an at home COVID test that was negative.   HPI: Cough This is a new problem. The current episode started in the past 7 days. The problem has been gradually worsening. The problem occurs every few minutes. The cough is Productive of sputum. Associated symptoms include ear congestion, ear pain, nasal congestion, postnasal drip, rhinorrhea and shortness of breath. Pertinent negatives include no chills, fever, headaches, myalgias, sore throat or wheezing. Risk factors for lung disease include smoking/tobacco exposure. She has tried rest for the symptoms. The treatment provided mild relief.   Problems: There are no problems to display for this patient.   Allergies:  Allergies  Allergen Reactions   Penicillins     Has patient had a PCN reaction causing immediate rash, facial/tongue/throat swelling, SOB or lightheadedness with hypotension: NO Has patient had a PCN reaction causing severe rash involving mucus membranes or skin necrosis: NO Has patient had a PCN reaction that required hospitalization NO Has patient had a PCN reaction occurring within the last 10 years: NO If all of the  above answers are "NO", then may proceed with Cephalosporin use.   Medications:  Current Outpatient Medications:    benzonatate (TESSALON PERLES) 100 MG capsule, Take 1 capsule (100 mg total) by mouth 3 (three) times daily as needed., Disp: 20 capsule, Rfl: 0   fluticasone (FLONASE) 50 MCG/ACT nasal spray, Place 2 sprays into both nostrils daily., Disp: 16 g, Rfl: 6   predniSONE (STERAPRED UNI-PAK 21 TAB) 10 MG (21) TBPK tablet, Use as directed, Disp: 21 tablet, Rfl: 0   ALPRAZolam  (XANAX) 0.5 MG tablet, alprazolam 0.5 mg tablet  as needed, Disp: , Rfl:    hydrOXYzine (ATARAX/VISTARIL) 25 MG tablet, SMARTSIG:0-4 Tablet(s) By Mouth Daily, Disp: , Rfl:    QUEtiapine (SEROQUEL) 25 MG tablet, Take 25 mg by mouth at bedtime., Disp: , Rfl:    sertraline (ZOLOFT) 100 MG tablet, Take 100 mg by mouth daily., Disp: , Rfl:   Observations/Objective: Patient is well-developed, well-nourished in no acute distress.  Resting comfortably  at home.  Head is normocephalic, atraumatic.  No labored breathing.  Speech is clear and coherent with logical content.  Patient is alert and oriented at baseline.  Coarse cough noted.   Assessment and Plan: 1. Acute bronchitis, unspecified organism - benzonatate (TESSALON PERLES) 100 MG capsule; Take 1 capsule (100 mg total) by mouth 3 (three) times daily as needed.  Dispense: 20 capsule; Refill: 0 - fluticasone (FLONASE) 50 MCG/ACT nasal spray; Place 2 sprays into both nostrils daily.  Dispense: 16 g; Refill: 6 - predniSONE (STERAPRED UNI-PAK 21 TAB) 10 MG (21) TBPK tablet; Use as directed  Dispense: 21 tablet; Refill: 0 - Take meds as prescribed - Use a cool mist humidifier  -Use saline nose sprays frequently -Force fluids -For any cough or congestion  Use plain Mucinex- regular strength or max strength is fine -For fever or aces or pains- take tylenol or ibuprofen. -Throat lozenges if help -Follow up if symptoms worsen or do not improve   Follow Up Instructions: I discussed the assessment and treatment plan with the patient. The patient was provided an opportunity to ask questions and all were answered. The patient agreed with the plan and demonstrated an understanding of the instructions.  A copy of instructions were sent to the patient via MyChart.  The patient was advised to call back or seek an in-person evaluation if the symptoms worsen or if the condition fails to improve as anticipated.  Time:  I spent 10 minutes with the patient  via telehealth technology discussing t10he above problems/concerns.    Jacqueline Rodney, FNP

## 2020-10-01 ENCOUNTER — Emergency Department (HOSPITAL_BASED_OUTPATIENT_CLINIC_OR_DEPARTMENT_OTHER): Payer: 59

## 2020-10-01 ENCOUNTER — Other Ambulatory Visit: Payer: Self-pay

## 2020-10-01 ENCOUNTER — Emergency Department (HOSPITAL_BASED_OUTPATIENT_CLINIC_OR_DEPARTMENT_OTHER)
Admission: EM | Admit: 2020-10-01 | Discharge: 2020-10-01 | Disposition: A | Discharge status: Home / Self Care | Payer: 59 | Attending: Emergency Medicine | Admitting: Emergency Medicine

## 2020-10-01 ENCOUNTER — Encounter (HOSPITAL_BASED_OUTPATIENT_CLINIC_OR_DEPARTMENT_OTHER): Payer: Self-pay

## 2020-10-01 DIAGNOSIS — J40 Bronchitis, not specified as acute or chronic: Secondary | ICD-10-CM | POA: Insufficient documentation

## 2020-10-01 DIAGNOSIS — J069 Acute upper respiratory infection, unspecified: Secondary | ICD-10-CM | POA: Insufficient documentation

## 2020-10-01 DIAGNOSIS — D72829 Elevated white blood cell count, unspecified: Secondary | ICD-10-CM | POA: Diagnosis not present

## 2020-10-01 DIAGNOSIS — Z20822 Contact with and (suspected) exposure to covid-19: Secondary | ICD-10-CM | POA: Insufficient documentation

## 2020-10-01 DIAGNOSIS — F17210 Nicotine dependence, cigarettes, uncomplicated: Secondary | ICD-10-CM | POA: Diagnosis not present

## 2020-10-01 DIAGNOSIS — R059 Cough, unspecified: Secondary | ICD-10-CM | POA: Diagnosis present

## 2020-10-01 HISTORY — DX: Depression, unspecified: F32.A

## 2020-10-01 LAB — RESP PANEL BY RT-PCR (FLU A&B, COVID) ARPGX2
Influenza A by PCR: NEGATIVE
Influenza B by PCR: NEGATIVE
SARS Coronavirus 2 by RT PCR: NEGATIVE

## 2020-10-01 LAB — CBC WITH DIFFERENTIAL/PLATELET
Abs Immature Granulocytes: 0.05 10*3/uL (ref 0.00–0.07)
Basophils Absolute: 0.1 10*3/uL (ref 0.0–0.1)
Basophils Relative: 1 %
Eosinophils Absolute: 0.1 10*3/uL (ref 0.0–0.5)
Eosinophils Relative: 0 %
HCT: 43.7 % (ref 36.0–46.0)
Hemoglobin: 15 g/dL (ref 12.0–15.0)
Immature Granulocytes: 0 %
Lymphocytes Relative: 26 %
Lymphs Abs: 4 10*3/uL (ref 0.7–4.0)
MCH: 31.3 pg (ref 26.0–34.0)
MCHC: 34.3 g/dL (ref 30.0–36.0)
MCV: 91.2 fL (ref 80.0–100.0)
Monocytes Absolute: 0.7 10*3/uL (ref 0.1–1.0)
Monocytes Relative: 5 %
Neutro Abs: 10.5 10*3/uL — ABNORMAL HIGH (ref 1.7–7.7)
Neutrophils Relative %: 68 %
Platelets: 315 10*3/uL (ref 150–400)
RBC: 4.79 MIL/uL (ref 3.87–5.11)
RDW: 13.4 % (ref 11.5–15.5)
WBC: 15.3 10*3/uL — ABNORMAL HIGH (ref 4.0–10.5)
nRBC: 0 % (ref 0.0–0.2)

## 2020-10-01 LAB — BASIC METABOLIC PANEL
Anion gap: 9 (ref 5–15)
BUN: 10 mg/dL (ref 6–20)
CO2: 27 mmol/L (ref 22–32)
Calcium: 9.1 mg/dL (ref 8.9–10.3)
Chloride: 102 mmol/L (ref 98–111)
Creatinine, Ser: 0.81 mg/dL (ref 0.44–1.00)
GFR, Estimated: >60 mL/min (ref >60)
Glucose, Bld: 95 mg/dL (ref 70–99)
Potassium: 3.7 mmol/L (ref 3.5–5.1)
Sodium: 138 mmol/L (ref 135–145)

## 2020-10-01 MED ORDER — SODIUM CHLORIDE 0.9 % IV BOLUS
1000.0000 mL | Freq: Once | INTRAVENOUS | Status: AC
  Administered 2020-10-01: 1000 mL via INTRAVENOUS

## 2020-10-01 MED ORDER — SODIUM CHLORIDE 0.9 % IV SOLN: INTRAVENOUS | Status: DC

## 2020-10-01 MED ORDER — ALBUTEROL SULFATE HFA 108 (90 BASE) MCG/ACT IN AERS
2.0000 | INHALATION_SPRAY | Freq: Four times a day (QID) | RESPIRATORY_TRACT | Status: DC
  Administered 2020-10-01: 2 via RESPIRATORY_TRACT
  Filled 2020-10-01: qty 6.7

## 2020-10-01 NOTE — ED Triage Notes (Signed)
Pt states she has about a 7 day history of cough, congestion, ears "stopped up".  Coughing up greenish/yellow phelgm.  Denies fever.  States took Covid test last Sunday and on Wednesday 6/22, both were negative.

## 2020-10-01 NOTE — ED Notes (Signed)
ED Provider at bedside. 

## 2020-10-01 NOTE — Discharge Instructions (Signed)
COVID testing influenza testing negative.  No signs of pneumonia.  Symptoms consistent with an upper respiratory infection most likely viral and bronchitis.  Continue prednisone.  Continue over-the-counter cough suppressants and also albuterol inhaler provided today 2 puffs every 6 hours for the next 7 days.  Return for any new or worse symptoms.  Work note provided.

## 2020-10-01 NOTE — ED Provider Notes (Signed)
[facility] EMERGENCY DEPT Provider Note   CSN: [id number] Arrival date & time: 10/01/20  1448     History Chief Complaint  Patient presents with   Cough    Jacqueline Ferguson is a 30 y.o. female.  Patient with a 7-day history of cough and congestion ears feeling stopped up coughing up greenish-yellow phlegm.  Denies fever.  Has had 2 home COVID test that were negative.  Most recent 1 was on June 22.  Patient on June 19 had a virtual visit with primary care doctor.  Started on [employer] and just plain Mucinex.  No known sick exposures.  No significant nausea vomiting or diarrhea.  Patient is also on a course of steroids.      Past Medical History:  Diagnosis Date   Anxiety    Depression     There are no problems to display for this patient.   Past Surgical History:  Procedure Laterality Date   BACK SURGERY     TONSILLECTOMY       OB History   No obstetric history on file.     No family history on file.  Social History   Tobacco Use   Smoking status: Every Day    Packs/day: 0.50    Pack years: 0.00    Types: Cigarettes   Smokeless tobacco: Never  Substance Use Topics   Alcohol use: Yes    Comment: occ    Drug use: Yes    Types: Marijuana    Home Medications Prior to Admission medications   Medication Sig Start Date End Date Taking? Authorizing Provider  benzonatate (TESSALON PERLES) 100 MG capsule Take 1 capsule (100 mg total) by mouth 3 (three) times daily as needed. 09/26/20  Yes Hawks, Christy A, FNP  clonazePAM (KLONOPIN) 0.5 MG tablet Take 0.5 mg by mouth 2 (two) times daily as needed for anxiety.   Yes [provider]  fluticasone (FLONASE) 50 MCG/ACT nasal spray Place 2 sprays into both nostrils daily. 09/26/20  Yes Hawks, Neysa Bonito A, FNP  hydrOXYzine (ATARAX/VISTARIL) 25 MG tablet SMARTSIG:0-4 Tablet(s) By Mouth Daily 09/05/20  Yes [provider]  predniSONE (STERAPRED UNI-PAK 21 TAB) 10 MG (21) TBPK tablet Use  as directed 09/26/20  Yes Hawks, Christy A, FNP  QUEtiapine (SEROQUEL) 25 MG tablet Take 25 mg by mouth at bedtime. 09/22/20  Yes [provider]  sertraline (ZOLOFT) 100 MG tablet Take 100 mg by mouth daily.   Yes [provider]  ALPRAZolam Prudy Feeler) 0.5 MG tablet alprazolam 0.5 mg tablet  as needed    [provider]    Allergies    Penicillins  Review of Systems   Review of Systems  Constitutional:  Negative for chills and fever.  HENT:  Positive for ear pain. Negative for sore throat.   Eyes:  Negative for pain and visual disturbance.  Respiratory:  Positive for cough. Negative for shortness of breath.   Cardiovascular:  Negative for chest pain and palpitations.  Gastrointestinal:  Negative for abdominal pain and vomiting.  Genitourinary:  Negative for dysuria and hematuria.  Musculoskeletal:  Negative for arthralgias and back pain.  Skin:  Negative for color change and rash.  Neurological:  Negative for seizures and syncope.  All other systems reviewed and are negative.  Physical Exam Updated Vital Signs BP 115/76 (BP Location: Right Arm)   Pulse 75   Temp 98 F (36.7 C) (Oral)   Resp 16   Ht 1.702 m (5\' 7" )  Wt 115.7 kg   LMP 09/24/2020   SpO2 100%   BMI 39.94 kg/m   Physical Exam Vitals and nursing note reviewed.  Constitutional:      General: She is not in acute distress.    Appearance: Normal appearance. She is well-developed. She is not ill-appearing.  HENT:     Head: Normocephalic and atraumatic.     Right Ear: Tympanic membrane, ear canal and external ear normal.     Left Ear: Tympanic membrane, ear canal and external ear normal.  Eyes:     Extraocular Movements: Extraocular movements intact.     Conjunctiva/sclera: Conjunctivae normal.     Pupils: Pupils are equal, round, and reactive to light.  Cardiovascular:     Rate and Rhythm: Normal rate and regular rhythm.     Heart sounds: No murmur heard. Pulmonary:     Effort:  Pulmonary effort is normal. No respiratory distress.     Breath sounds: Normal breath sounds. No wheezing, rhonchi or rales.  Abdominal:     Palpations: Abdomen is soft.     Tenderness: There is no abdominal tenderness.  Musculoskeletal:        General: Normal range of motion.     Cervical back: Normal range of motion and neck supple. No rigidity.  Skin:    General: Skin is warm and dry.     Capillary Refill: Capillary refill takes less than 2 seconds.  Neurological:     General: No focal deficit present.     Mental Status: She is alert and oriented to person, place, and time.     Cranial Nerves: No cranial nerve deficit.     Sensory: No sensory deficit.     Motor: No weakness.    ED Results / Procedures / Treatments   Labs (all labs ordered are listed, but only abnormal results are displayed) Labs Reviewed  CBC WITH DIFFERENTIAL/PLATELET - Abnormal; Notable for the following components:      Result Value   WBC 15.3 (*)    Neutro Abs 10.5 (*)    All other components within normal limits  RESP PANEL BY RT-PCR (FLU A&B, COVID) [id number]  BASIC METABOLIC PANEL    EKG None  Radiology DG Chest Port 1 View  Result Date: 10/01/2020 CLINICAL DATA:  Cough and chest congestion. EXAM: PORTABLE CHEST 1 VIEW COMPARISON:  07/07/2015 FINDINGS: Normal sized heart. Clear lungs. Interval moderate peribronchial thickening in the right perihilar region. Unremarkable bones. IMPRESSION: Moderate bronchitic changes. Electronically Signed   By: Beckie Salts M.D.   On: 10/01/2020 16:30    Procedures Procedures   Medications Ordered in ED Medications  0.9 %  sodium chloride infusion ( Intravenous Not Given 10/01/20 1621)  albuterol (VENTOLIN HFA) 108 (90 Base) MCG/ACT inhaler 2 puff (has no administration in time range)  sodium chloride 0.9 % bolus 1,000 mL (0 mLs Intravenous Stopped 10/01/20 1715)    ED Course  I have reviewed the triage vital signs and the nursing notes.  Pertinent labs &  imaging results that were available during my care of the patient were reviewed by me and considered in my medical decision making (see chart for details).    MDM Rules/Calculators/A&P                          Work-up here COVID influenza testing negative.  There is a leukocytosis with a white blood cell count of 15,000.  Chest x-ray negative for pneumonia.  Is consistent with bronchitis.  Basic metabolic panel is normal.  Patient nontoxic no acute distress.  With this work-up most likely this is a viral upper respiratory infection with bronchitis.  Patient requested albuterol inhaler no wheezing today.  But that could help with her cough.  She is on steroids as well already.  Stable for discharge home.  Work note provided. Final Clinical Impression(s) / ED Diagnoses Final diagnoses:  Upper respiratory tract infection, unspecified type  Bronchitis    Rx / DC Orders ED Discharge Orders     None        Vanetta Mulders, MD 10/01/20 1726

## 2021-03-16 DIAGNOSIS — J069 Acute upper respiratory infection, unspecified: Secondary | ICD-10-CM | POA: Diagnosis not present

## 2021-03-16 DIAGNOSIS — J101 Influenza due to other identified influenza virus with other respiratory manifestations: Secondary | ICD-10-CM | POA: Diagnosis not present

## 2021-04-16 DIAGNOSIS — F17210 Nicotine dependence, cigarettes, uncomplicated: Secondary | ICD-10-CM | POA: Diagnosis not present

## 2021-04-16 DIAGNOSIS — F411 Generalized anxiety disorder: Secondary | ICD-10-CM | POA: Diagnosis not present

## 2021-04-16 DIAGNOSIS — Z79899 Other long term (current) drug therapy: Secondary | ICD-10-CM | POA: Diagnosis not present

## 2021-04-16 DIAGNOSIS — F332 Major depressive disorder, recurrent severe without psychotic features: Secondary | ICD-10-CM | POA: Diagnosis not present

## 2021-05-05 DIAGNOSIS — Z659 Problem related to unspecified psychosocial circumstances: Secondary | ICD-10-CM | POA: Diagnosis not present

## 2021-05-05 DIAGNOSIS — F17210 Nicotine dependence, cigarettes, uncomplicated: Secondary | ICD-10-CM | POA: Diagnosis not present

## 2021-05-05 DIAGNOSIS — F411 Generalized anxiety disorder: Secondary | ICD-10-CM | POA: Diagnosis not present

## 2021-05-05 DIAGNOSIS — F332 Major depressive disorder, recurrent severe without psychotic features: Secondary | ICD-10-CM | POA: Diagnosis not present

## 2021-06-01 DIAGNOSIS — F17210 Nicotine dependence, cigarettes, uncomplicated: Secondary | ICD-10-CM | POA: Diagnosis not present

## 2021-06-01 DIAGNOSIS — F411 Generalized anxiety disorder: Secondary | ICD-10-CM | POA: Diagnosis not present

## 2021-06-01 DIAGNOSIS — Z659 Problem related to unspecified psychosocial circumstances: Secondary | ICD-10-CM | POA: Diagnosis not present

## 2021-06-01 DIAGNOSIS — F39 Unspecified mood [affective] disorder: Secondary | ICD-10-CM | POA: Diagnosis not present

## 2021-06-29 DIAGNOSIS — F411 Generalized anxiety disorder: Secondary | ICD-10-CM | POA: Diagnosis not present

## 2021-06-29 DIAGNOSIS — Z659 Problem related to unspecified psychosocial circumstances: Secondary | ICD-10-CM | POA: Diagnosis not present

## 2021-06-29 DIAGNOSIS — F332 Major depressive disorder, recurrent severe without psychotic features: Secondary | ICD-10-CM | POA: Diagnosis not present

## 2021-06-29 DIAGNOSIS — F17210 Nicotine dependence, cigarettes, uncomplicated: Secondary | ICD-10-CM | POA: Diagnosis not present

## 2021-09-11 DIAGNOSIS — F411 Generalized anxiety disorder: Secondary | ICD-10-CM | POA: Diagnosis not present

## 2021-09-11 DIAGNOSIS — Z659 Problem related to unspecified psychosocial circumstances: Secondary | ICD-10-CM | POA: Diagnosis not present

## 2021-09-11 DIAGNOSIS — F39 Unspecified mood [affective] disorder: Secondary | ICD-10-CM | POA: Diagnosis not present

## 2021-09-11 DIAGNOSIS — F17210 Nicotine dependence, cigarettes, uncomplicated: Secondary | ICD-10-CM | POA: Diagnosis not present

## 2021-11-12 DIAGNOSIS — Z659 Problem related to unspecified psychosocial circumstances: Secondary | ICD-10-CM | POA: Diagnosis not present

## 2021-11-12 DIAGNOSIS — F332 Major depressive disorder, recurrent severe without psychotic features: Secondary | ICD-10-CM | POA: Diagnosis not present

## 2021-11-12 DIAGNOSIS — F17210 Nicotine dependence, cigarettes, uncomplicated: Secondary | ICD-10-CM | POA: Diagnosis not present

## 2021-11-12 DIAGNOSIS — F411 Generalized anxiety disorder: Secondary | ICD-10-CM | POA: Diagnosis not present

## 2021-12-01 IMAGING — DX DG CHEST 1V PORT
1 series · 1 of 1 positions shown · non-contrast
Comparison: 07/07/2015

CLINICAL DATA: Cough and chest congestion.

EXAM:
PORTABLE CHEST 1 VIEW

[chest]
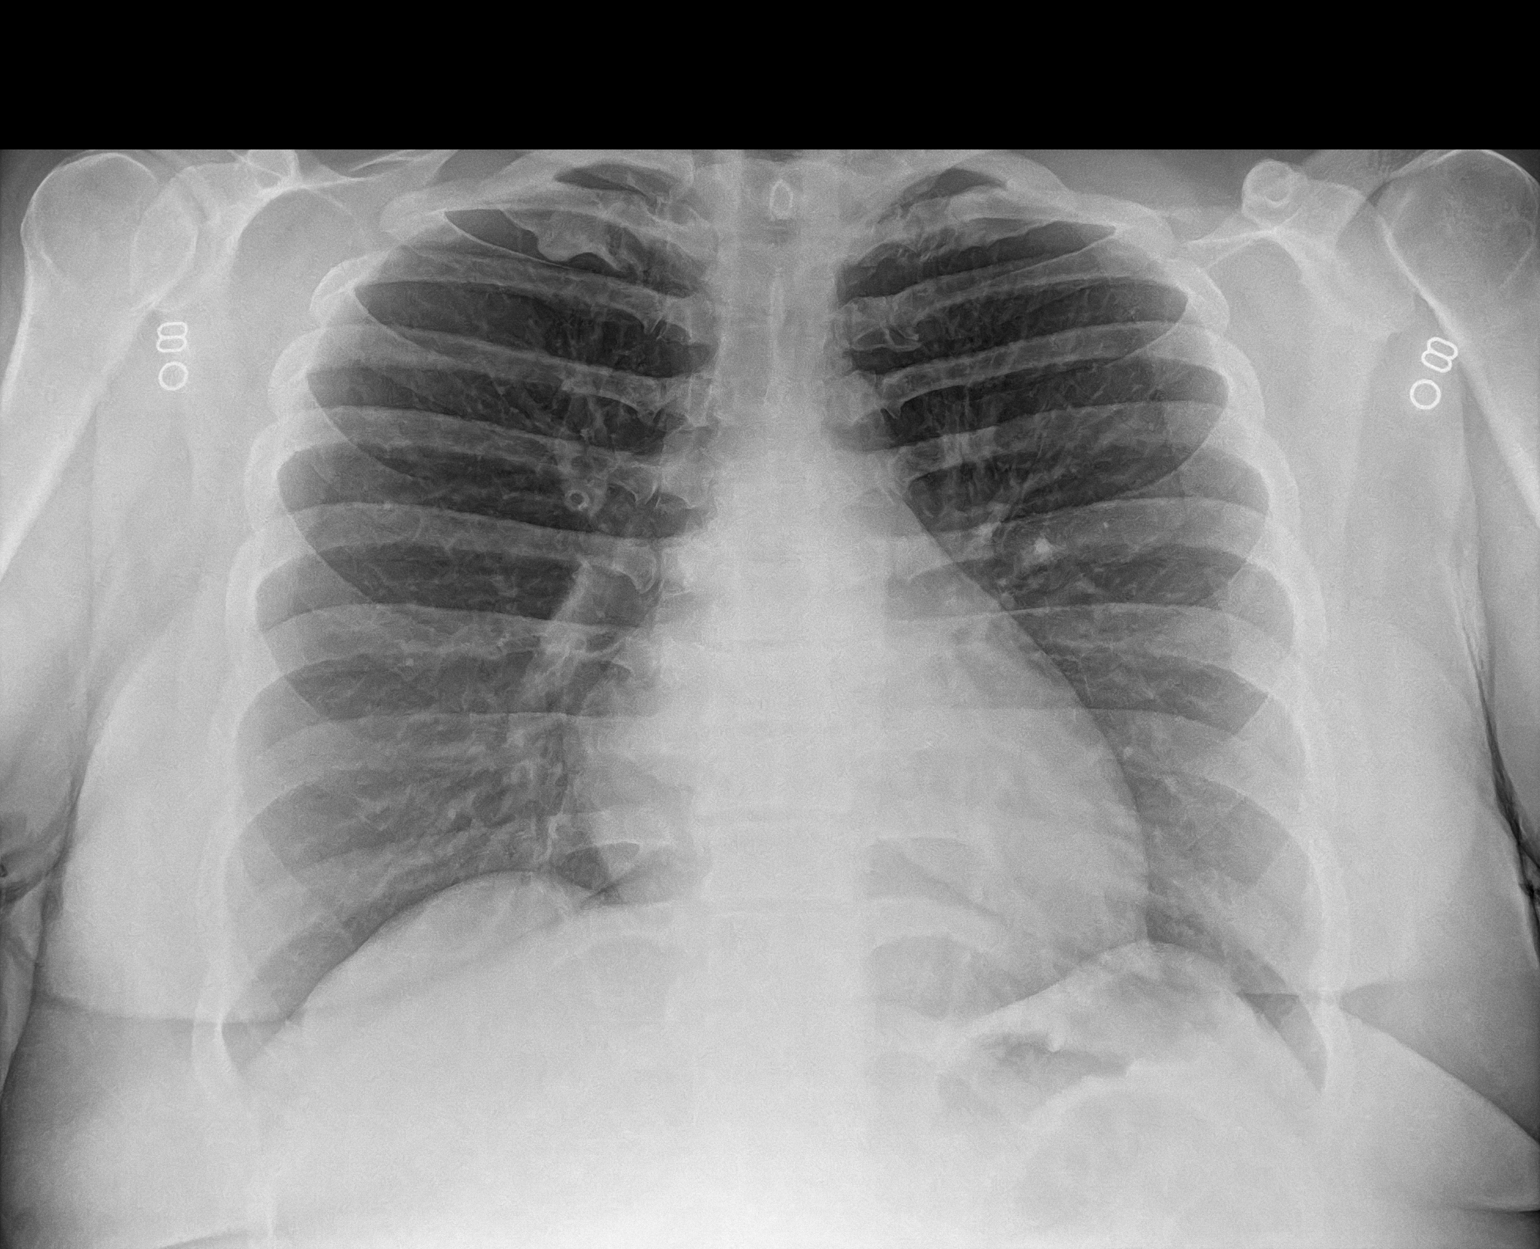

[1 of 1 positions shown; findings below may reference images not displayed]

FINDINGS: Normal sized heart. Clear lungs. Interval moderate peribronchial
thickening in the right perihilar region. Unremarkable bones.
IMPRESSION: Moderate bronchitic changes.

## 2023-08-26 ENCOUNTER — Telehealth: Admitting: Family

## 2023-08-26 DIAGNOSIS — B3731 Acute candidiasis of vulva and vagina: Secondary | ICD-10-CM

## 2023-08-26 MED ORDER — FLUCONAZOLE 150 MG PO TABS: 150.0000 mg | ORAL_TABLET | ORAL | 0 refills | Status: AC | PRN

## 2023-08-26 NOTE — Progress Notes (Signed)

## 2023-09-07 DIAGNOSIS — F4312 Post-traumatic stress disorder, chronic: Secondary | ICD-10-CM | POA: Diagnosis not present

## 2023-10-04 ENCOUNTER — Telehealth: Payer: Self-pay | Admitting: *Deleted

## 2023-10-04 NOTE — Telephone Encounter (Signed)
 I left the patient a message asking her to come in tomorrow at 8 am for her appointment because we need xrays of her foot prior to Dr. Joya seeing her.

## 2023-10-05 ENCOUNTER — Ambulatory Visit

## 2023-10-05 ENCOUNTER — Ambulatory Visit (INDEPENDENT_AMBULATORY_CARE_PROVIDER_SITE_OTHER): Admitting: Podiatry

## 2023-10-05 ENCOUNTER — Encounter: Payer: Self-pay | Admitting: Podiatry

## 2023-10-05 DIAGNOSIS — M779 Enthesopathy, unspecified: Secondary | ICD-10-CM

## 2023-10-05 DIAGNOSIS — M79671 Pain in right foot: Secondary | ICD-10-CM | POA: Diagnosis not present

## 2023-10-05 DIAGNOSIS — M7661 Achilles tendinitis, right leg: Secondary | ICD-10-CM | POA: Diagnosis not present

## 2023-10-05 MED ORDER — MELOXICAM 15 MG PO TABS: 15.0000 mg | ORAL_TABLET | Freq: Every day | ORAL | 0 refills | Status: AC

## 2023-10-05 NOTE — Patient Instructions (Signed)

## 2023-10-05 NOTE — Progress Notes (Signed)
  Subjective:  Patient ID: Jacqueline Ferguson, female    DOB: October 10, 1990,   MRN: [id number]  Chief Complaint  Patient presents with   Foot Pain    My right foot has developed a knot in the back of my heel.  I think it's causing me to have Charlie Horses.    33 y.o. female presents for concern of right heel pain. Relates a knot that popped up a while ago. She has been getting pain and cramping in the back of her heel that has limited her ability to walk. Relates a lot of charlie horses. She denies any current treatments.   . Denies any other pedal complaints. Denies n/v/f/c.   Past Medical History:  Diagnosis Date   Anxiety    Depression     Objective:  Physical Exam: Vascular: DP/PT pulses 2/4 bilateral. CFT <3 seconds. Normal hair growth on digits. No edema.  Skin. No lacerations or abrasions bilateral feet.  Musculoskeletal: MMT 5/5 bilateral lower extremities in DF, PF, Inversion and Eversion. Deceased ROM in DF of ankle joint. Tender to inerstion of achilles tendon with palpable spurring noted. Difficult to DF foot. No pain with PF.  Neurological: Sensation intact to light touch.   Assessment:   1. Tendonitis, Achilles, right      Plan:  Patient was evaluated and treated and all questions answered. -Xrays reviewed. No acute fractures or dislocations noted. There is spurring noted to posterior heel.  -Discussed Achilles insertional tendonitis and treatment options with patient.  -Discussed stretching exercises. -Discussed cramping and advised on upping water intake and electrolytes.  Discussed other ways to prevent cramps.  -Rx Meloxicam provided. CMP reviewed and kidney function appropriate.  -Heel lifts provided and discussed proper shoewear.  -Discussed if no improvement will consider MRI/PT/EPAT/PRP injections.  -Patient to return to office as needed or sooner if condition worsens.   Asberry Failing, DPM

## 2023-10-12 DIAGNOSIS — F331 Major depressive disorder, recurrent, moderate: Secondary | ICD-10-CM | POA: Diagnosis not present

## 2023-10-19 DIAGNOSIS — Z13220 Encounter for screening for lipoid disorders: Secondary | ICD-10-CM | POA: Diagnosis not present

## 2023-10-19 DIAGNOSIS — B9689 Other specified bacterial agents as the cause of diseases classified elsewhere: Secondary | ICD-10-CM | POA: Diagnosis not present

## 2023-10-19 DIAGNOSIS — N760 Acute vaginitis: Secondary | ICD-10-CM | POA: Diagnosis not present

## 2023-10-19 DIAGNOSIS — Z1329 Encounter for screening for other suspected endocrine disorder: Secondary | ICD-10-CM | POA: Diagnosis not present

## 2023-10-19 DIAGNOSIS — Z113 Encounter for screening for infections with a predominantly sexual mode of transmission: Secondary | ICD-10-CM | POA: Diagnosis not present

## 2023-10-19 DIAGNOSIS — Z131 Encounter for screening for diabetes mellitus: Secondary | ICD-10-CM | POA: Diagnosis not present

## 2023-10-19 DIAGNOSIS — Z130 Encounter for screening for diseases of the blood and blood-forming organs and certain disorders involving the immune mechanism: Secondary | ICD-10-CM | POA: Diagnosis not present

## 2023-10-19 DIAGNOSIS — F17200 Nicotine dependence, unspecified, uncomplicated: Secondary | ICD-10-CM | POA: Diagnosis not present

## 2023-10-19 DIAGNOSIS — Z13228 Encounter for screening for other metabolic disorders: Secondary | ICD-10-CM | POA: Diagnosis not present

## 2023-10-19 DIAGNOSIS — Z01419 Encounter for gynecological examination (general) (routine) without abnormal findings: Secondary | ICD-10-CM | POA: Diagnosis not present

## 2023-10-26 DIAGNOSIS — F331 Major depressive disorder, recurrent, moderate: Secondary | ICD-10-CM | POA: Diagnosis not present

## 2023-11-16 ENCOUNTER — Ambulatory Visit: Admitting: Podiatry

## 2023-11-23 DIAGNOSIS — N760 Acute vaginitis: Secondary | ICD-10-CM | POA: Diagnosis not present

## 2023-11-23 DIAGNOSIS — F17200 Nicotine dependence, unspecified, uncomplicated: Secondary | ICD-10-CM | POA: Diagnosis not present

## 2023-11-26 DIAGNOSIS — F4312 Post-traumatic stress disorder, chronic: Secondary | ICD-10-CM | POA: Diagnosis not present
